# Patient Record
Sex: Male | Born: 1995 | Race: Black or African American | Hispanic: No | Marital: Single | Smoking: Never smoker
Health system: Southern US, Community
[De-identification: ages and names within clinical notes are randomized; demographics above are authoritative.]

---
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
## 2014-02-20 ENCOUNTER — Emergency Department (INDEPENDENT_AMBULATORY_CARE_PROVIDER_SITE_OTHER)
Admission: EM | Admit: 2014-02-20 | Discharge: 2014-02-20 | Disposition: A | Discharge status: Home / Self Care | Payer: Medicaid Other | Source: Home / Self Care | Attending: Family Medicine | Admitting: Family Medicine

## 2014-02-20 ENCOUNTER — Encounter (HOSPITAL_COMMUNITY): Payer: Self-pay | Admitting: Emergency Medicine

## 2014-02-20 DIAGNOSIS — K2970 Gastritis, unspecified, without bleeding: Secondary | ICD-10-CM

## 2014-02-20 MED ORDER — OMEPRAZOLE 40 MG PO CPDR: 40.0000 mg | DELAYED_RELEASE_CAPSULE | Freq: Every day | ORAL | Status: AC

## 2014-02-20 MED ORDER — SUCRALFATE 1 G PO TABS
1.0000 g | ORAL_TABLET | Freq: Three times a day (TID) | ORAL | Status: AC
Start: 2014-02-20 — End: ?

## 2014-02-20 MED ORDER — GI COCKTAIL ~~LOC~~
ORAL | Status: AC
  Filled 2014-02-20: qty 30

## 2014-02-20 MED ORDER — GI COCKTAIL ~~LOC~~
30.0000 mL | Freq: Once | ORAL | Status: AC
  Administered 2014-02-20: 30 mL via ORAL

## 2014-02-20 NOTE — ED Notes (Signed)
Pt here today because he has had chest pain since Thursday, pt said that he took a Xanax bar on Thursday and has had the chest pain and a head ache since that time, pt also said that his stomach has been hurting

## 2014-02-20 NOTE — ED Provider Notes (Signed)
Stephen Mason is a 18 y.o. male who presents to Urgent Care today for chest pain headache abdominal pain and cramping. Symptoms started on Friday 3 days ago. Patient purchase some Xanax on the street and took them Friday night. Saturday morning he started feeling poorly. He denies any central exertional chest pain. No radiating pain. No palpitations or shortness of breath. He feels well otherwise.   No past medical history on file. History  Substance Use Topics  . Smoking status: Never Smoker   . Smokeless tobacco: Not on file  . Alcohol Use: No   ROS as above Medications: No current facility-administered medications for this encounter.   Current Outpatient Prescriptions  Medication Sig Dispense Refill  . omeprazole (PRILOSEC) 40 MG capsule Take 1 capsule (40 mg total) by mouth daily.  30 capsule  1  . sucralfate (CARAFATE) 1 G tablet Take 1 tablet (1 g total) by mouth 4 (four) times daily -  with meals and at bedtime.  60 tablet  0    Exam:  BP 126/72  Pulse 72  Temp(Src) 97.3 F (36.3 C) (Oral)  Resp 14  SpO2 100% Gen: Well NAD HEENT: EOMI,  MMM Lungs: Normal work of breathing. CTABL Heart: RRR no MRG Abd: NABS, Soft. Nondistended, mildly tender upper left quadrant. No rebound or guarding. No masses palpated. Exts: Brisk capillary refill, warm and well perfused.   Patient was given a GI cocktail and felt better.  EKG sinus bradycardia at 59 beats per minute. No ST segment elevation or depression. Mild J-point elevation is present.  No results found for this or any previous visit (from the past 24 hour(s)). No results found.  Assessment and Plan: 18 y.o. male with gastritis. Patient improved with a GI cocktail. Plan to treat with Carafate and omeprazole. Followup with PCP. Avoid street drugs in the future.  Discussed warning signs or symptoms. Please see discharge instructions. Patient expresses understanding.     [name], MD 02/20/14 [phone]

## 2014-02-20 NOTE — Discharge Instructions (Signed)
Thank you for coming in today. Take the carafate with meals for at least 1 weeks.  Take omeprazole daily.  Come back as needed.  If your belly pain worsens, or you have high fever, bad vomiting, blood in your stool or black tarry stool go to the Emergency Room.   Please do not take recreational drugs.    Gastritis, Adult Gastritis is soreness and swelling (inflammation) of the lining of the stomach. Gastritis can develop as a sudden onset (acute) or long-term (chronic) condition. If gastritis is not treated, it can lead to stomach bleeding and ulcers. CAUSES  Gastritis occurs when the stomach lining is weak or damaged. Digestive juices from the stomach then inflame the weakened stomach lining. The stomach lining may be weak or damaged due to viral or bacterial infections. One common bacterial infection is the Helicobacter pylori infection. Gastritis can also result from excessive alcohol consumption, taking certain medicines, or having too much acid in the stomach.  SYMPTOMS  In some cases, there are no symptoms. When symptoms are present, they may include:  Pain or a burning sensation in the upper abdomen.  Nausea.  Vomiting.  An uncomfortable feeling of fullness after eating. DIAGNOSIS  Your caregiver may suspect you have gastritis based on your symptoms and a physical exam. To determine the cause of your gastritis, your caregiver may perform the following:  Blood or stool tests to check for the H pylori bacterium.  Gastroscopy. A thin, flexible tube (endoscope) is passed down the esophagus and into the stomach. The endoscope has a light and camera on the end. Your caregiver uses the endoscope to view the inside of the stomach.  Taking a tissue sample (biopsy) from the stomach to examine under a microscope. TREATMENT  Depending on the cause of your gastritis, medicines may be prescribed. If you have a bacterial infection, such as an H pylori infection, antibiotics may be given. If  your gastritis is caused by too much acid in the stomach, H2 blockers or antacids may be given. Your caregiver may recommend that you stop taking aspirin, ibuprofen, or other nonsteroidal anti-inflammatory drugs (NSAIDs). HOME CARE INSTRUCTIONS  Only take over-the-counter or prescription medicines as directed by your caregiver.  If you were given antibiotic medicines, take them as directed. Finish them even if you start to feel better.  Drink enough fluids to keep your urine clear or pale yellow.  Avoid foods and drinks that make your symptoms worse, such as:  Caffeine or alcoholic drinks.  Chocolate.  Peppermint or mint flavorings.  Garlic and onions.  Spicy foods.  Citrus fruits, such as oranges, lemons, or limes.  Tomato-based foods such as sauce, chili, salsa, and pizza.  Fried and fatty foods.  Eat small, frequent meals instead of large meals. SEEK IMMEDIATE MEDICAL CARE IF:   You have black or dark red stools.  You vomit blood or material that looks like coffee grounds.  You are unable to keep fluids down.  Your abdominal pain gets worse.  You have a fever.  You do not feel better after 1 week.  You have any other questions or concerns. MAKE SURE YOU:  Understand these instructions.  Will watch your condition.  Will get help right away if you are not doing well or get worse. Document Released: 04/29/2001 Document Revised: 11/04/2011 Document Reviewed: 06/18/2011 [facility] Patient Information 2015 [place], [place]. This information is not intended to replace advice given to you by your health care provider. Make sure you discuss any questions  you have with your health care provider.

## 2014-02-21 ENCOUNTER — Encounter (HOSPITAL_COMMUNITY): Payer: Self-pay | Admitting: Emergency Medicine

## 2014-02-21 ENCOUNTER — Emergency Department (HOSPITAL_COMMUNITY): Payer: Medicaid Other

## 2014-02-21 DIAGNOSIS — Z79899 Other long term (current) drug therapy: Secondary | ICD-10-CM | POA: Insufficient documentation

## 2014-02-21 DIAGNOSIS — G43009 Migraine without aura, not intractable, without status migrainosus: Secondary | ICD-10-CM | POA: Diagnosis not present

## 2014-02-21 DIAGNOSIS — R079 Chest pain, unspecified: Secondary | ICD-10-CM | POA: Diagnosis present

## 2014-02-21 DIAGNOSIS — R1084 Generalized abdominal pain: Secondary | ICD-10-CM | POA: Insufficient documentation

## 2014-02-21 DIAGNOSIS — R0789 Other chest pain: Secondary | ICD-10-CM | POA: Insufficient documentation

## 2014-02-21 LAB — BASIC METABOLIC PANEL
Anion gap: 13 (ref 5–15)
BUN: 14 mg/dL (ref 6–23)
CALCIUM: 9.5 mg/dL (ref 8.4–10.5)
CO2: 26 meq/L (ref 19–32)
CREATININE: 1.08 mg/dL (ref 0.50–1.35)
Chloride: 99 mEq/L (ref 96–112)
GFR calc Af Amer: >90 mL/min (ref >90)
GFR calc non Af Amer: >90 mL/min (ref >90)
GLUCOSE: 108 mg/dL — AB (ref 70–99)
Potassium: 4 mEq/L (ref 3.7–5.3)
Sodium: 138 mEq/L (ref 137–147)

## 2014-02-21 LAB — CBC
HEMATOCRIT: 39.8 % (ref 39.0–52.0)
HEMOGLOBIN: 14 g/dL (ref 13.0–17.0)
MCH: 30.9 pg (ref 26.0–34.0)
MCHC: 35.2 g/dL (ref 30.0–36.0)
MCV: 87.9 fL (ref 78.0–100.0)
Platelets: 215 10*3/uL (ref 150–400)
RBC: 4.53 MIL/uL (ref 4.22–5.81)
RDW: 12.7 % (ref 11.5–15.5)
WBC: 5.8 10*3/uL (ref 4.0–10.5)

## 2014-02-21 LAB — I-STAT TROPONIN, ED: Troponin i, poc: 0 ng/mL (ref 0.00–0.08)

## 2014-02-21 NOTE — ED Notes (Signed)
Pt. reported intermittent mid chest pain , mid abdominal pain and headache onset Friday last week , denies SOB /nausea or diaphoresis .

## 2014-02-22 ENCOUNTER — Encounter (HOSPITAL_COMMUNITY): Payer: Self-pay | Admitting: Emergency Medicine

## 2014-02-22 ENCOUNTER — Emergency Department (HOSPITAL_COMMUNITY)
Admission: EM | Admit: 2014-02-22 | Discharge: 2014-02-22 | Disposition: A | Discharge status: Home / Self Care | Payer: Medicaid Other | Attending: Emergency Medicine | Admitting: Emergency Medicine

## 2014-02-22 DIAGNOSIS — R0789 Other chest pain: Secondary | ICD-10-CM

## 2014-02-22 DIAGNOSIS — G43009 Migraine without aura, not intractable, without status migrainosus: Secondary | ICD-10-CM

## 2014-02-22 DIAGNOSIS — R1084 Generalized abdominal pain: Secondary | ICD-10-CM

## 2014-02-22 LAB — RAPID URINE DRUG SCREEN, HOSP PERFORMED
Amphetamines: NOT DETECTED
Barbiturates: NOT DETECTED
Benzodiazepines: NOT DETECTED
COCAINE: NOT DETECTED
Opiates: NOT DETECTED
TETRAHYDROCANNABINOL: NOT DETECTED

## 2014-02-22 LAB — HEPATIC FUNCTION PANEL
ALBUMIN: 4.1 g/dL (ref 3.5–5.2)
ALK PHOS: 63 U/L (ref 39–117)
ALT: 15 U/L (ref 0–53)
AST: 22 U/L (ref 0–37)
BILIRUBIN TOTAL: 0.3 mg/dL (ref 0.3–1.2)
Bilirubin, Direct: <0.2 mg/dL (ref 0.0–0.3)
Total Protein: 7.6 g/dL (ref 6.0–8.3)

## 2014-02-22 LAB — SALICYLATE LEVEL

## 2014-02-22 LAB — LIPASE, BLOOD: Lipase: 17 U/L (ref 11–59)

## 2014-02-22 LAB — ACETAMINOPHEN LEVEL: Acetaminophen (Tylenol), Serum: <15 ug/mL (ref 10–30)

## 2014-02-22 MED ORDER — GI COCKTAIL ~~LOC~~
30.0000 mL | Freq: Once | ORAL | Status: AC
Start: 2014-02-22 — End: 2014-02-22
  Administered 2014-02-22: 30 mL via ORAL
  Filled 2014-02-22: qty 30

## 2014-02-22 MED ORDER — ACETAMINOPHEN 500 MG PO TABS
1000.0000 mg | ORAL_TABLET | Freq: Once | ORAL | Status: AC
  Administered 2014-02-22: 1000 mg via ORAL
  Filled 2014-02-22: qty 2

## 2014-02-22 MED ORDER — ONDANSETRON 4 MG PO TBDP
4.0000 mg | ORAL_TABLET | Freq: Once | ORAL | Status: AC
  Administered 2014-02-22: 4 mg via ORAL
  Filled 2014-02-22: qty 1

## 2014-02-22 NOTE — ED Notes (Signed)
Reports: tired and dizzy (mild), (denies: pain, sob, nausea or other sx), alert, NAD, calm, interactive, [facility]

## 2014-02-22 NOTE — Discharge Instructions (Signed)
Chest Pain (Nonspecific) °It is often hard to give a specific diagnosis for the cause of chest pain. There is always a chance that your pain could be related to something serious, such as a heart attack or a blood clot in the lungs. You need to follow up with your health care provider for further evaluation. °CAUSES  °· Heartburn. °· Pneumonia or bronchitis. °· Anxiety or stress. °· Inflammation around your heart (pericarditis) or lung (pleuritis or pleurisy). °· A blood clot in the lung. °· A collapsed lung (pneumothorax). It can develop suddenly on its own (spontaneous pneumothorax) or from trauma to the chest. °· Shingles infection (herpes zoster virus). °The chest wall is composed of bones, muscles, and cartilage. Any of these can be the source of the pain. °· The bones can be bruised by injury. °· The muscles or cartilage can be strained by coughing or overwork. °· The cartilage can be affected by inflammation and become sore (costochondritis). °DIAGNOSIS  °Lab tests or other studies may be needed to find the cause of your pain. Your health care provider may have you take a test called an ambulatory electrocardiogram (ECG). An ECG records your heartbeat patterns over a 24-hour period. You may also have other tests, such as: °· Transthoracic echocardiogram (TTE). During echocardiography, sound waves are used to evaluate how blood flows through your heart. °· Transesophageal echocardiogram (TEE). °· Cardiac monitoring. This allows your health care provider to monitor your heart rate and rhythm in real time. °· Holter monitor. This is a portable device that records your heartbeat and can help diagnose heart arrhythmias. It allows your health care provider to track your heart activity for several days, if needed. °· Stress tests by exercise or by giving medicine that makes the heart beat faster. °TREATMENT  °· Treatment depends on what may be causing your chest pain. Treatment may include: °¨ Acid blockers for  heartburn. °¨ Anti-inflammatory medicine. °¨ Pain medicine for inflammatory conditions. °¨ Antibiotics if an infection is present. °· You may be advised to change lifestyle habits. This includes stopping smoking and avoiding alcohol, caffeine, and chocolate. °· You may be advised to keep your head raised (elevated) when sleeping. This reduces the chance of acid going backward from your stomach into your esophagus. °Most of the time, nonspecific chest pain will improve within 2-3 days with rest and mild pain medicine.  °HOME CARE INSTRUCTIONS  °· If antibiotics were prescribed, take them as directed. Finish them even if you start to feel better. °· For the next few days, avoid physical activities that bring on chest pain. Continue physical activities as directed. °· Do not use any tobacco products, including cigarettes, chewing tobacco, or electronic cigarettes. °· Avoid drinking alcohol. °· Only take medicine as directed by your health care provider. °· Follow your health care provider's suggestions for further testing if your chest pain does not go away. °· Keep any follow-up appointments you made. If you do not go to an appointment, you could develop lasting (chronic) problems with pain. If there is any problem keeping an appointment, call to reschedule. °SEEK MEDICAL CARE IF:  °· Your chest pain does not go away, even after treatment. °· You have a rash with blisters on your chest. °· You have a fever. °SEEK IMMEDIATE MEDICAL CARE IF:  °· You have increased chest pain or pain that spreads to your arm, neck, jaw, back, or abdomen. °· You have shortness of breath. °· You have an increasing cough, or you cough   up blood.  You have severe back or abdominal pain.  You feel nauseous or vomit.  You have severe weakness.  You faint.  You have chills. This is an emergency. Do not wait to see if the pain will go away. Get medical help at once. Call your local emergency services (911 in [place] Do not drive  yourself to the hospital. MAKE SURE YOU:   Understand these instructions.  Will watch your condition.  Will get help right away if you are not doing well or get worse. Document Released: 02/12/2005 Document Revised: 05/10/2013 Document Reviewed: 12/09/2007 [facility] Patient Information 2015 [place], [place]. This information is not intended to replace advice given to you by your health care provider. Make sure you discuss any questions you have with your health care provider.  Abdominal Pain Many things can cause abdominal pain. Usually, abdominal pain is not caused by a disease and will improve without treatment. It can often be observed and treated at home. Your health care provider will do a physical exam and possibly order blood tests and X-rays to help determine the seriousness of your pain. However, in many cases, more time must pass before a clear cause of the pain can be found. Before that point, your health care provider may not know if you need more testing or further treatment. HOME CARE INSTRUCTIONS  Monitor your abdominal pain for any changes. The following actions may help to alleviate any discomfort you are experiencing:  Only take over-the-counter or prescription medicines as directed by your health care provider.  Do not take laxatives unless directed to do so by your health care provider.  Try a clear liquid diet (broth, tea, or water) as directed by your health care provider. Slowly move to a bland diet as tolerated. SEEK MEDICAL CARE IF:  You have unexplained abdominal pain.  You have abdominal pain associated with nausea or diarrhea.  You have pain when you urinate or have a bowel movement.  You experience abdominal pain that wakes you in the night.  You have abdominal pain that is worsened or improved by eating food.  You have abdominal pain that is worsened with eating fatty foods.  You have a fever. SEEK IMMEDIATE MEDICAL CARE IF:   Your pain does not go  away within 2 hours.  You keep throwing up (vomiting).  Your pain is felt only in portions of the abdomen, such as the right side or the left lower portion of the abdomen.  You pass bloody or black tarry stools. MAKE SURE YOU:  Understand these instructions.   Will watch your condition.   Will get help right away if you are not doing well or get worse.  Document Released: 02/12/2005 Document Revised: 05/10/2013 Document Reviewed: 01/12/2013 [facility] Patient Information 2015 [place], [place]. This information is not intended to replace advice given to you by your health care provider. Make sure you discuss any questions you have with your health care provider.  Migraine Headache A migraine headache is an intense, throbbing pain on one or both sides of your head. A migraine can last for 30 minutes to several hours. CAUSES  The exact cause of a migraine headache is not always known. However, a migraine may be caused when nerves in the brain become irritated and release chemicals that cause inflammation. This causes pain. Certain things may also trigger migraines, such as:  Alcohol.  Smoking.  Stress.  Menstruation.  Aged cheeses.  Foods or drinks that contain nitrates, glutamate, aspartame, or  tyramine.  Lack of sleep.  Chocolate.  Caffeine.  Hunger.  Physical exertion.  Fatigue.  Medicines used to treat chest pain (nitroglycerine), birth control pills, estrogen, and some blood pressure medicines. SIGNS AND SYMPTOMS  Pain on one or both sides of your head.  Pulsating or throbbing pain.  Severe pain that prevents daily activities.  Pain that is aggravated by any physical activity.  Nausea, vomiting, or both.  Dizziness.  Pain with exposure to bright lights, loud noises, or activity.  General sensitivity to bright lights, loud noises, or smells. Before you get a migraine, you may get warning signs that a migraine is coming (aura). An aura may  include:  Seeing flashing lights.  Seeing bright spots, halos, or zigzag lines.  Having tunnel vision or blurred vision.  Having feelings of numbness or tingling.  Having trouble talking.  Having muscle weakness. DIAGNOSIS  A migraine headache is often diagnosed based on:  Symptoms.  Physical exam.  A CT scan or MRI of your head. These imaging tests cannot diagnose migraines, but they can help rule out other causes of headaches. TREATMENT Medicines may be given for pain and nausea. Medicines can also be given to help prevent recurrent migraines.  HOME CARE INSTRUCTIONS  Only take over-the-counter or prescription medicines for pain or discomfort as directed by your health care provider. The use of long-term narcotics is not recommended.  Lie down in a dark, quiet room when you have a migraine.  Keep a journal to find out what may trigger your migraine headaches. For example, write down:  What you eat and drink.  How much sleep you get.  Any change to your diet or medicines.  Limit alcohol consumption.  Quit smoking if you smoke.  Get 7-9 hours of sleep, or as recommended by your health care provider.  Limit stress.  Keep lights dim if bright lights bother you and make your migraines worse. SEEK IMMEDIATE MEDICAL CARE IF:   Your migraine becomes severe.  You have a fever.  You have a stiff neck.  You have vision loss.  You have muscular weakness or loss of muscle control.  You start losing your balance or have trouble walking.  You feel faint or pass out.  You have severe symptoms that are different from your first symptoms. MAKE SURE YOU:   Understand these instructions.  Will watch your condition.  Will get help right away if you are not doing well or get worse. Document Released: 05/05/2005 Document Revised: 09/19/2013 Document Reviewed: 01/10/2013 [facility] Patient Information 2015 [place], [place]. This information is not intended to replace  advice given to you by your health care provider. Make sure you discuss any questions you have with your health care provider.

## 2014-02-22 NOTE — ED Provider Notes (Signed)
TIME SEEN: 3:00 AM  CHIEF COMPLAINT: Headache, chest pain, abdominal pain  HPI: Patient is a 18 year old male with history of migraine headaches who presents to the emergency department with multiple complaints. He reports that on Friday, 4 days ago he took 3 Xanax tablets from a friend. He is not prescribed this medication. He is not 100% sure that this was a Xanax. He states that he is having a frontal "dense" headache that he has had in the past that usually resolves with Aleve but did not take anything for his headache. He is also having left-sided chest pain that he describes feeling like he was "punched" in the chest. He states that walking makes his pain worse and movement. It is not exertional or pleuritic. There is no shortness of breath. He is also having diffuse abdominal pain that he cannot describe. He has had nausea but no vomiting or diarrhea. Reports he has had chills but no documented fevers. Seen in urgent care yesterday and given GI cocktail which improved his pain. He was discharged with omeprazole and Carafate. Denies any sick contacts or recent travel. No history of PE or DVT, recent prolonged immobilization such as long flight or hospitalization, fracture, surgery, trauma. His mother brought him in today because she is concerned that the pills that he took 4 days ago may be affecting him. Denies any other coingestants. No heavy NSAID use. No bloody stools or melena.  ROS: See HPI Constitutional: no fever  Eyes: no drainage  ENT: no runny nose   Cardiovascular:  no chest pain  Resp: no SOB  GI: no vomiting GU: no dysuria Integumentary: no rash  Allergy: no hives  Musculoskeletal: no leg swelling  Neurological: no slurred speech ROS otherwise negative  PAST MEDICAL HISTORY/PAST SURGICAL HISTORY:  History reviewed. No pertinent past medical history.  MEDICATIONS:  Prior to Admission medications   Medication Sig Start Date End Date Taking? Authorizing Provider  omeprazole  (PRILOSEC) 40 MG capsule Take 1 capsule (40 mg total) by mouth daily. 02/20/14  Yes [name], MD  sucralfate (CARAFATE) 1 G tablet Take 1 tablet (1 g total) by mouth 4 (four) times daily -  with meals and at bedtime. 02/20/14  Yes [name], MD    ALLERGIES:  No Known Allergies  SOCIAL HISTORY:  History  Substance Use Topics  . Smoking status: Never Smoker   . Smokeless tobacco: Not on file  . Alcohol Use: No    FAMILY HISTORY: No family history on file.  EXAM: BP 148/93  Pulse 56  Temp(Src) 98.4 F (36.9 C) (Oral)  Resp 18  Ht 5\' 9"  (1.753 m)  Wt 188 lb (85.276 kg)  BMI 27.75 kg/m2  SpO2 100% CONSTITUTIONAL: Alert and oriented and responds appropriately to questions. Well-appearing; well-nourished HEAD: Normocephalic EYES: Conjunctivae clear, PERRL ENT: normal nose; no rhinorrhea; moist mucous membranes; pharynx without lesions noted NECK: Supple, no meningismus, no LAD  CARD: RRR; S1 and S2 appreciated; no murmurs, no clicks, no rubs, no gallops RESP: Normal chest excursion without splinting or tachypnea; breath sounds clear and equal bilaterally; no wheezes, no rhonchi, no rales, speaking full sentences, no respiratory distress or hypoxia ABD/GI: Normal bowel sounds; non-distended; soft, non-tender, no rebound, no guarding, no peritoneal signs, negative Murphy sign, no tenderness at McBurney's point BACK:  The back appears normal and is non-tender to palpation, there is no CVA tenderness EXT: Normal ROM in all joints; non-tender to palpation; no edema; normal capillary refill; no cyanosis  SKIN: Normal color for age and race; warm NEURO: Moves all extremities equally, sensation to light touch intact diffusely, cranial nerves 2 through contact, normal gait PSYCH: The patient's mood and manner are appropriate. Grooming and personal hygiene are appropriate.  MEDICAL DECISION MAKING: Patient here with multiple complaints. Have very low suspicion for any  life-threatening injury or illness today. He is hemodynamically stable, neurologically intact, has a benign abdominal exam. Labs ordered in triage and then completely normal including a negative troponin and chest x-ray. EKG shows early repolarization without reciprocal changes that is similar to his prior EKG. He is PERC negative and has no risk factors for ACS. Suspect some of this may be related to gastritis, GERD. We'll give GI cocktail. We'll give him Tylenol for his headache and Zofran for his nausea. We'll on LFTs and lipase given his abdominal pain and obtain a salicylate, Tylenol level and UDS given his history of unknown ingestion. Anticipate if workup is unremarkable and the patient will be discharged home. Family comfortable with plan.  ED PROGRESS: Patient reports feeling better after Tylenol, Zofran and GI cocktail. His LFTs, lipase, urine drug screen, Tylenol and salicylate levels were normal. At that he is safe to be discharged home. Have advised him to avoid NSAIDs, alcohol, spicy or acidic foods at this time. We'll have him continue to use Tylenol as needed for headaches. We'll give work and school note for tomorrow. Discussed return precautions. Patient and mother at bedside verbalize understanding and are comfortable with plan.     EKG Interpretation  Date/Time:  Tuesday February 21 2014 23:13:35 EDT Ventricular Rate:  54 PR Interval:  162 QRS Duration: 100 QT Interval:  400 QTC Calculation: 379 R Axis:   78 Text Interpretation:  Sinus bradycardia with sinus arrhythmia Early repolarization Otherwise normal ECG No significant change since last tracing Confirmed by WARD,  DO, KRISTEN [phone]) on 02/22/2014 2:45:08 AM        Layla Maw Ward, DO 02/22/14 [id number]

## 2015-10-24 ENCOUNTER — Ambulatory Visit (HOSPITAL_COMMUNITY)
Admission: EM | Admit: 2015-10-24 | Discharge: 2015-10-24 | Disposition: A | Discharge status: Home / Self Care | Payer: Medicaid Other | Attending: Emergency Medicine | Admitting: Emergency Medicine

## 2015-10-24 ENCOUNTER — Encounter (HOSPITAL_COMMUNITY): Payer: Self-pay | Admitting: *Deleted

## 2015-10-24 DIAGNOSIS — A64 Unspecified sexually transmitted disease: Secondary | ICD-10-CM

## 2015-10-24 MED ORDER — AZITHROMYCIN 250 MG PO TABS
1000.0000 mg | ORAL_TABLET | Freq: Once | ORAL | Status: AC
  Administered 2015-10-24: 1000 mg via ORAL

## 2015-10-24 MED ORDER — CEFTRIAXONE SODIUM 250 MG IJ SOLR
INTRAMUSCULAR | Status: AC
  Filled 2015-10-24: qty 250

## 2015-10-24 MED ORDER — AZITHROMYCIN 250 MG PO TABS
ORAL_TABLET | ORAL | Status: AC
  Filled 2015-10-24: qty 4

## 2015-10-24 MED ORDER — CEFTRIAXONE SODIUM 250 MG IJ SOLR
250.0000 mg | Freq: Once | INTRAMUSCULAR | Status: AC
  Administered 2015-10-24: 250 mg via INTRAMUSCULAR

## 2015-10-24 NOTE — ED Notes (Signed)
Pt  Reports  Symptoms  Of  Penile  Discharge     With  Onset    yest    Unprotected  Sex  5  Days  Ago

## 2015-10-24 NOTE — Discharge Instructions (Signed)
Sexually Transmitted Disease  A sexually transmitted disease (STD) is a disease or infection that may be passed (transmitted) from person to person, usually during sexual activity. This may happen by way of saliva, semen, blood, vaginal mucus, or urine. Common STDs include:  · Gonorrhea.  · Chlamydia.  · Syphilis.  · HIV and AIDS.  · Genital herpes.  · Hepatitis B and C.  · Trichomonas.  · Human papillomavirus (HPV).  · Pubic lice.  · Scabies.  · Mites.  · Bacterial vaginosis.  WHAT ARE CAUSES OF STDs?  An STD may be caused by bacteria, a virus, or parasites. STDs are often transmitted during sexual activity if one person is infected. However, they may also be transmitted through nonsexual means. STDs may be transmitted after:   · Sexual intercourse with an infected person.  · Sharing sex toys with an infected person.  · Sharing needles with an infected person or using unclean piercing or tattoo needles.  · Having intimate contact with the genitals, mouth, or rectal areas of an infected person.  · Exposure to infected fluids during birth.  WHAT ARE THE SIGNS AND SYMPTOMS OF STDs?  Different STDs have different symptoms. Some people may not have any symptoms. If symptoms are present, they may include:  · Painful or bloody urination.  · Pain in the pelvis, abdomen, vagina, anus, throat, or eyes.  · A skin rash, itching, or irritation.  · Growths, ulcerations, blisters, or sores in the genital and anal areas.  · Abnormal vaginal discharge with or without bad odor.  · Penile discharge in men.  · Fever.  · Pain or bleeding during sexual intercourse.  · Swollen glands in the groin area.  · Yellow skin and eyes (jaundice). This is seen with hepatitis.  · Swollen testicles.  · Infertility.  · Sores and blisters in the mouth.  HOW ARE STDs DIAGNOSED?  To make a diagnosis, your health care provider may:  · Take a medical history.  · Perform a physical exam.  · Take a sample of any discharge to examine.  · Swab the throat,  cervix, opening to the penis, rectum, or vagina for testing.  · Test a sample of your first morning urine.  · Perform blood tests.  · Perform a Pap test, if this applies.  · Perform a colposcopy.  · Perform a laparoscopy.  HOW ARE STDs TREATED?  Treatment depends on the STD. Some STDs may be treated but not cured.  · Chlamydia, gonorrhea, trichomonas, and syphilis can be cured with antibiotic medicine.  · Genital herpes, hepatitis, and HIV can be treated, but not cured, with prescribed medicines. The medicines lessen symptoms.  · Genital warts from HPV can be treated with medicine or by freezing, burning (electrocautery), or surgery. Warts may come back.  · HPV cannot be cured with medicine or surgery. However, abnormal areas may be removed from the cervix, vagina, or vulva.  · If your diagnosis is confirmed, your recent sexual partners need treatment. This is true even if they are symptom-free or have a negative culture or evaluation. They should not have sex until their health care providers say it is okay.  · Your health care provider may test you for infection again 3 months after treatment.  HOW CAN I REDUCE MY RISK OF GETTING AN STD?  Take these steps to reduce your risk of getting an STD:  · Use latex condoms, dental dams, and water-soluble lubricants during sexual activity. Do not use   petroleum jelly or oils.  · Avoid having multiple sex partners.  · Do not have sex with someone who has other sex partners  · Do not have sex with anyone you do not know or who is at high risk for an STD.  · Avoid risky sex practices that can break your skin.  · Do not have sex if you have open sores on your mouth or skin.  · Avoid drinking too much alcohol or taking illegal drugs. Alcohol and drugs can affect your judgment and put you in a vulnerable position.  · Avoid engaging in oral and anal sex acts.  · Get vaccinated for HPV and hepatitis. If you have not received these vaccines in the past, talk to your health care  provider about whether one or both might be right for you.  · If you are at risk of being infected with HIV, it is recommended that you take a prescription medicine daily to prevent HIV infection. This is called pre-exposure prophylaxis (PrEP). You are considered at risk if:    You are a man who has sex with other men (MSM).    You are a heterosexual man or woman and are sexually active with more than one partner.    You take drugs by injection.    You are sexually active with a partner who has HIV.  · Talk with your health care provider about whether you are at high risk of being infected with HIV. If you choose to begin PrEP, you should first be tested for HIV. You should then be tested every 3 months for as long as you are taking PrEP.  WHAT SHOULD I DO IF I THINK I HAVE AN STD?  · See your health care provider.  · Tell your sexual partner(s). They should be tested and treated for any STDs.  · Do not have sex until your health care provider says it is okay.  WHEN SHOULD I GET IMMEDIATE MEDICAL CARE?  Contact your health care provider right away if:   · You have severe abdominal pain.  · You are a man and notice swelling or pain in your testicles.  · You are a woman and notice swelling or pain in your vagina.     This information is not intended to replace advice given to you by your health care provider. Make sure you discuss any questions you have with your health care provider.     Document Released: 07/26/2002 Document Revised: 05/26/2014 Document Reviewed: 11/23/2012  Elsevier Interactive Patient Education ©2016 [employer]    Safe Sex  Safe sex is about reducing the risk of giving or getting a sexually transmitted disease (STD). STDs are spread through sexual contact involving the genitals, mouth, or rectum. Some STDs can be cured and others cannot. Safe sex can also prevent unintended pregnancies.   WHAT ARE SOME SAFE SEX PRACTICES?  · Limit your sexual activity to only one partner who is having sex with  only you.  · Talk to your partner about his or her past partners, past STDs, and drug use.  · Use a condom every time you have sexual intercourse. This includes vaginal, oral, and anal sexual activity. Both females and males should wear condoms during oral sex. Only use latex or polyurethane condoms and water-based lubricants. Using petroleum-based lubricants or oils to lubricate a condom will weaken the condom and increase the chance that it will break. The condom should be in place from the beginning to   the end of sexual activity. Wearing a condom reduces, but does not completely eliminate, your risk of getting or giving an STD. STDs can be spread by contact with infected body fluids and skin.  · Get vaccinated for hepatitis B and HPV.  · Avoid alcohol and recreational drugs, which can affect your judgment. You may forget to use a condom or participate in high-risk sex.  · For females, avoid douching after sexual intercourse. Douching can spread an infection farther into the reproductive tract.  · Check your body for signs of sores, blisters, rashes, or unusual discharge. See your health care provider if you notice any of these signs.  · Avoid sexual contact if you have symptoms of an infection or are being treated for an STD. If you or your partner has herpes, avoid sexual contact when blisters are present. Use condoms at all other times.  · If you are at risk of being infected with HIV, it is recommended that you take a prescription medicine daily to prevent HIV infection. This is called pre-exposure prophylaxis (PrEP). You are considered at risk if:    You are a man who has sex with other men (MSM).    You are a heterosexual man or woman who is sexually active with more than one partner.    You take drugs by injection.    You are sexually active with a partner who has HIV.  · Talk with your health care provider about whether you are at high risk of being infected with HIV. If you choose to begin PrEP, you  should first be tested for HIV. You should then be tested every 3 months for as long as you are taking PrEP.  · See your health care provider for regular screenings, exams, and tests for other STDs. Before having sex with a new partner, each of you should be screened for STDs and should talk about the results with each other.  WHAT ARE THE BENEFITS OF SAFE SEX?   · There is less chance of getting or giving an STD.  · You can prevent unwanted or unintended pregnancies.  · By discussing safe sex concerns with your partner, you may increase feelings of intimacy, comfort, trust, and honesty between the two of you.     This information is not intended to replace advice given to you by your health care provider. Make sure you discuss any questions you have with your health care provider.     Document Released: 06/12/2004 Document Revised: 05/26/2014 Document Reviewed: 10/27/2011  Elsevier Interactive Patient Education ©2016 [employer]

## 2015-10-24 NOTE — ED Notes (Signed)
Pt informed  To  Give  [place]  A  Working  Phone  Number before  He  Leaves    adised  No  Sex  X  10  Days  Advised  To  Use  Condoms

## 2015-10-24 NOTE — ED Provider Notes (Signed)
CSN: [id number]     Arrival date & time 10/24/15  1810 History   First MD Initiated Contact with Patient 10/24/15 1913     Chief Complaint  Patient presents with  . Penile Discharge   (Consider location/radiation/quality/duration/timing/severity/associated sxs/prior Treatment) HPI History obtained from patient:  Pt presents with the cc of:  Discharge from penis Duration of symptoms: Started Sunday Treatment prior to arrival: Notes prior treatment Context: New sexual partner Saturday night onset of burning sensations Sunday and discharged from penis today Other symptoms include: Dysuria Pain score: 2 with urination FAMILY HISTORY: Family history of hypertension       History reviewed. No pertinent past medical history. History reviewed. No pertinent past surgical history. History reviewed. No pertinent family history. Social History  Substance Use Topics  . Smoking status: Never Smoker   . Smokeless tobacco: None  . Alcohol Use: No    Review of Systems  Denies: HEADACHE, NAUSEA, ABDOMINAL PAIN, CHEST PAIN, CONGESTION, DYSURIA, SHORTNESS OF BREATH  Allergies  Review of patient's allergies indicates no known allergies.  Home Medications   Prior to Admission medications   Medication Sig Start Date End Date Taking? Authorizing Provider  omeprazole (PRILOSEC) 40 MG capsule Take 1 capsule (40 mg total) by mouth daily. 02/20/14   [name], MD  sucralfate (CARAFATE) 1 G tablet Take 1 tablet (1 g total) by mouth 4 (four) times daily -  with meals and at bedtime. 02/20/14   [name], MD   Meds Ordered and Administered this Visit   Medications  azithromycin (ZITHROMAX) tablet 1,000 mg (not administered)  cefTRIAXone (ROCEPHIN) injection 250 mg (not administered)    BP 139/83 mmHg  Pulse 66  Temp(Src) 98.4 F (36.9 C) (Oral)  Resp 16  SpO2 100% No data found.   Physical Exam NURSES NOTES AND VITAL SIGNS REVIEWED. CONSTITUTIONAL: Well developed, well nourished,  no acute distress HEENT: normocephalic, atraumatic EYES: Conjunctiva normal NECK:normal ROM, supple, no adenopathy PULMONARY:No respiratory distress, normal effort ABDOMINAL: Soft, ND, NT BS+, No CVAT MUSCULOSKELETAL: Normal ROM of all extremities,  SKIN: warm and dry without rash PSYCHIATRIC: Mood and affect, behavior are normal   OFFER FOR CHAPARONE, DECLINED. GENITALIA: NORMAL EXTERNAL MALE PENIS AND SCROTUM.  GREEN DISCHARGE FROM PENIS, NO PALPABLE NODES.   ED Course  Procedures (including critical care time)  Labs Review Labs Reviewed  HIV ANTIBODY (ROUTINE TESTING)  CYTOLOGY, (ORAL, ANAL, URETHRAL) ANCILLARY ONLY  Pending  Imaging Review No results found.   Visual Acuity Review  Right Eye Distance:   Left Eye Distance:   Bilateral Distance:    Right Eye Near:   Left Eye Near:    Bilateral Near:      Expect full recovery from STD. We'll treat with azithromycin and ceftriaxone.  HIV test pending MDM   1. STD (sexually transmitted disease)     Patient is reassured that there are no issues that require transfer to higher level of care at this time or additional tests. Patient is advised to continue home symptomatic treatment. Patient is advised that if there are new or worsening symptoms to attend the emergency department, contact primary care provider, or return to [facility]. Instructions of care provided discharged home in stable condition.    THIS NOTE WAS GENERATED USING A VOICE RECOGNITION SOFTWARE PROGRAM. ALL REASONABLE EFFORTS  WERE MADE TO PROOFREAD THIS DOCUMENT FOR ACCURACY.  I have verbally reviewed the discharge instructions with the patient. A printed AVS was given to the patient.  All questions were answered prior to discharge.      Tharon Aquas, PA 10/24/15 1926

## 2015-10-25 LAB — CYTOLOGY, (ORAL, ANAL, URETHRAL) ANCILLARY ONLY
Chlamydia: NEGATIVE
Neisseria Gonorrhea: POSITIVE — AB
TRICH (WINDOWPATH): NEGATIVE

## 2015-10-25 LAB — HIV ANTIBODY (ROUTINE TESTING W REFLEX): HIV SCREEN 4TH GENERATION: NONREACTIVE

## 2015-10-30 ENCOUNTER — Telehealth (HOSPITAL_COMMUNITY): Payer: Self-pay | Admitting: Emergency Medicine

## 2015-10-30 NOTE — ED Notes (Signed)
Called [phone]; n/a Need to give lab results from recent visit on 6/7 Also let pt know labs can be obtained from MyChart  Per Dr. Dayton ScrapeMurray,  Notes Recorded by Eustace MooreLaura W Murray, MD on 10/27/2015 at 6:37 PM Please let patient/health department know that test for gonorrhea was positive; this was treated at [facility] visit with IM rocephin 250 mg and po zithromax 1g. Sexual partners need to be notified and tested/treated.  Tests for chlamydia, trichomonas, and HIV were all negative. Recheck as needed. LM   [facility]

## 2015-11-05 NOTE — ED Notes (Signed)
Called [phone]; n/a... Mailed letter as 3rd attempt Need to give lab results from recent visit on 6/7 Also let pt know labs can be obtained from MyChart  Per Dr. Dayton ScrapeMurray,  Notes Recorded by Eustace MooreLaura W Murray, MD on 10/27/2015 at 6:37 PM Please let patient/health department know that test for gonorrhea was positive; [facility] rocephin 250 mg and po zithromax 1g. Sexual partners need to be notified and tested/treated.  Tests for chlamydia, trichomonas, and HIV were all negative. Recheck as needed. LM  [facility]

## 2015-11-17 IMAGING — CR DG CHEST 2V
2 series · 2 of 2 positions shown · non-contrast
Comparison: None.

CLINICAL DATA: Acute onset of mid to left-sided chest pain and
migraine headache. Generalized abdominal pain and cough. Initial
encounter.

EXAM:
CHEST  2 VIEW

[w chest pa]
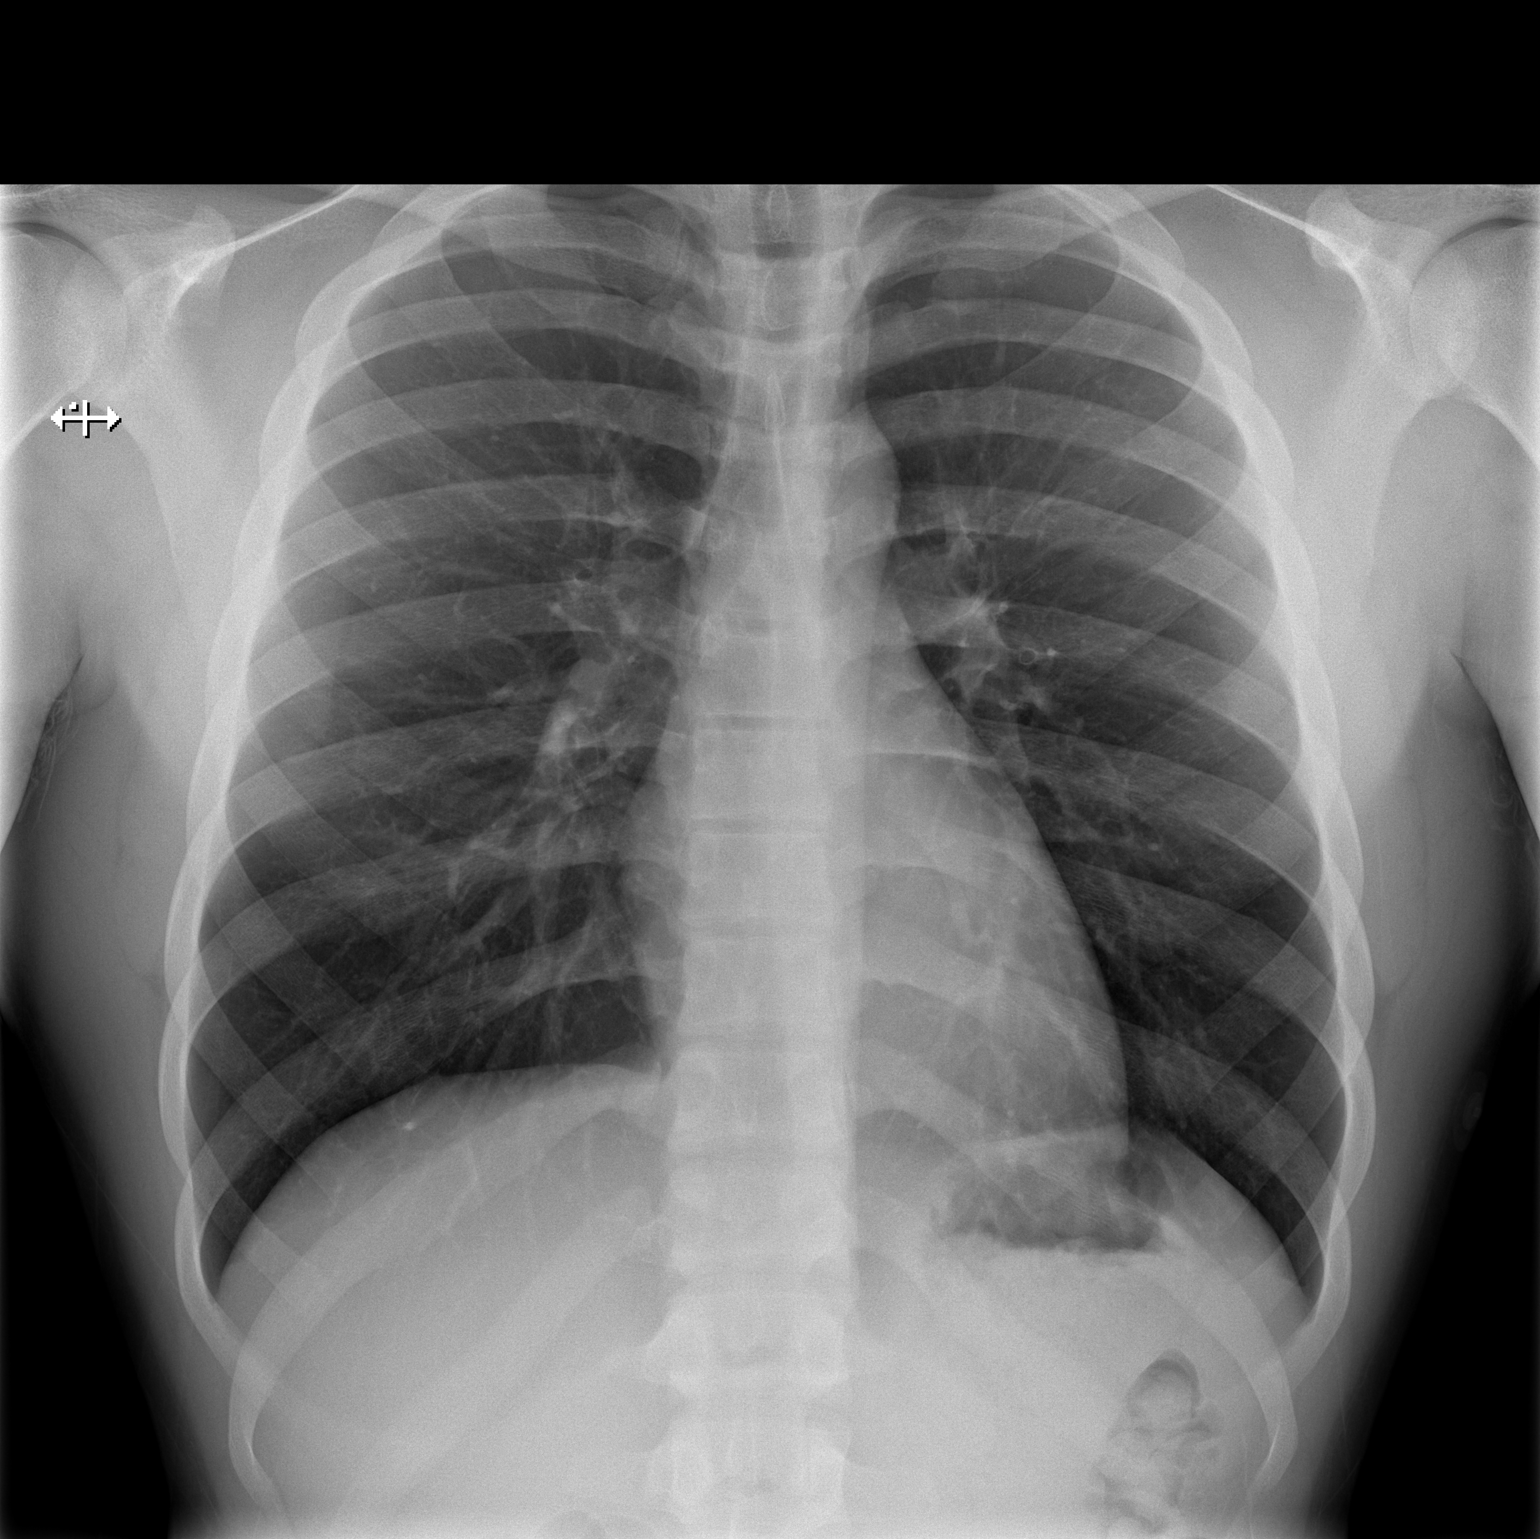

[w chest lat]
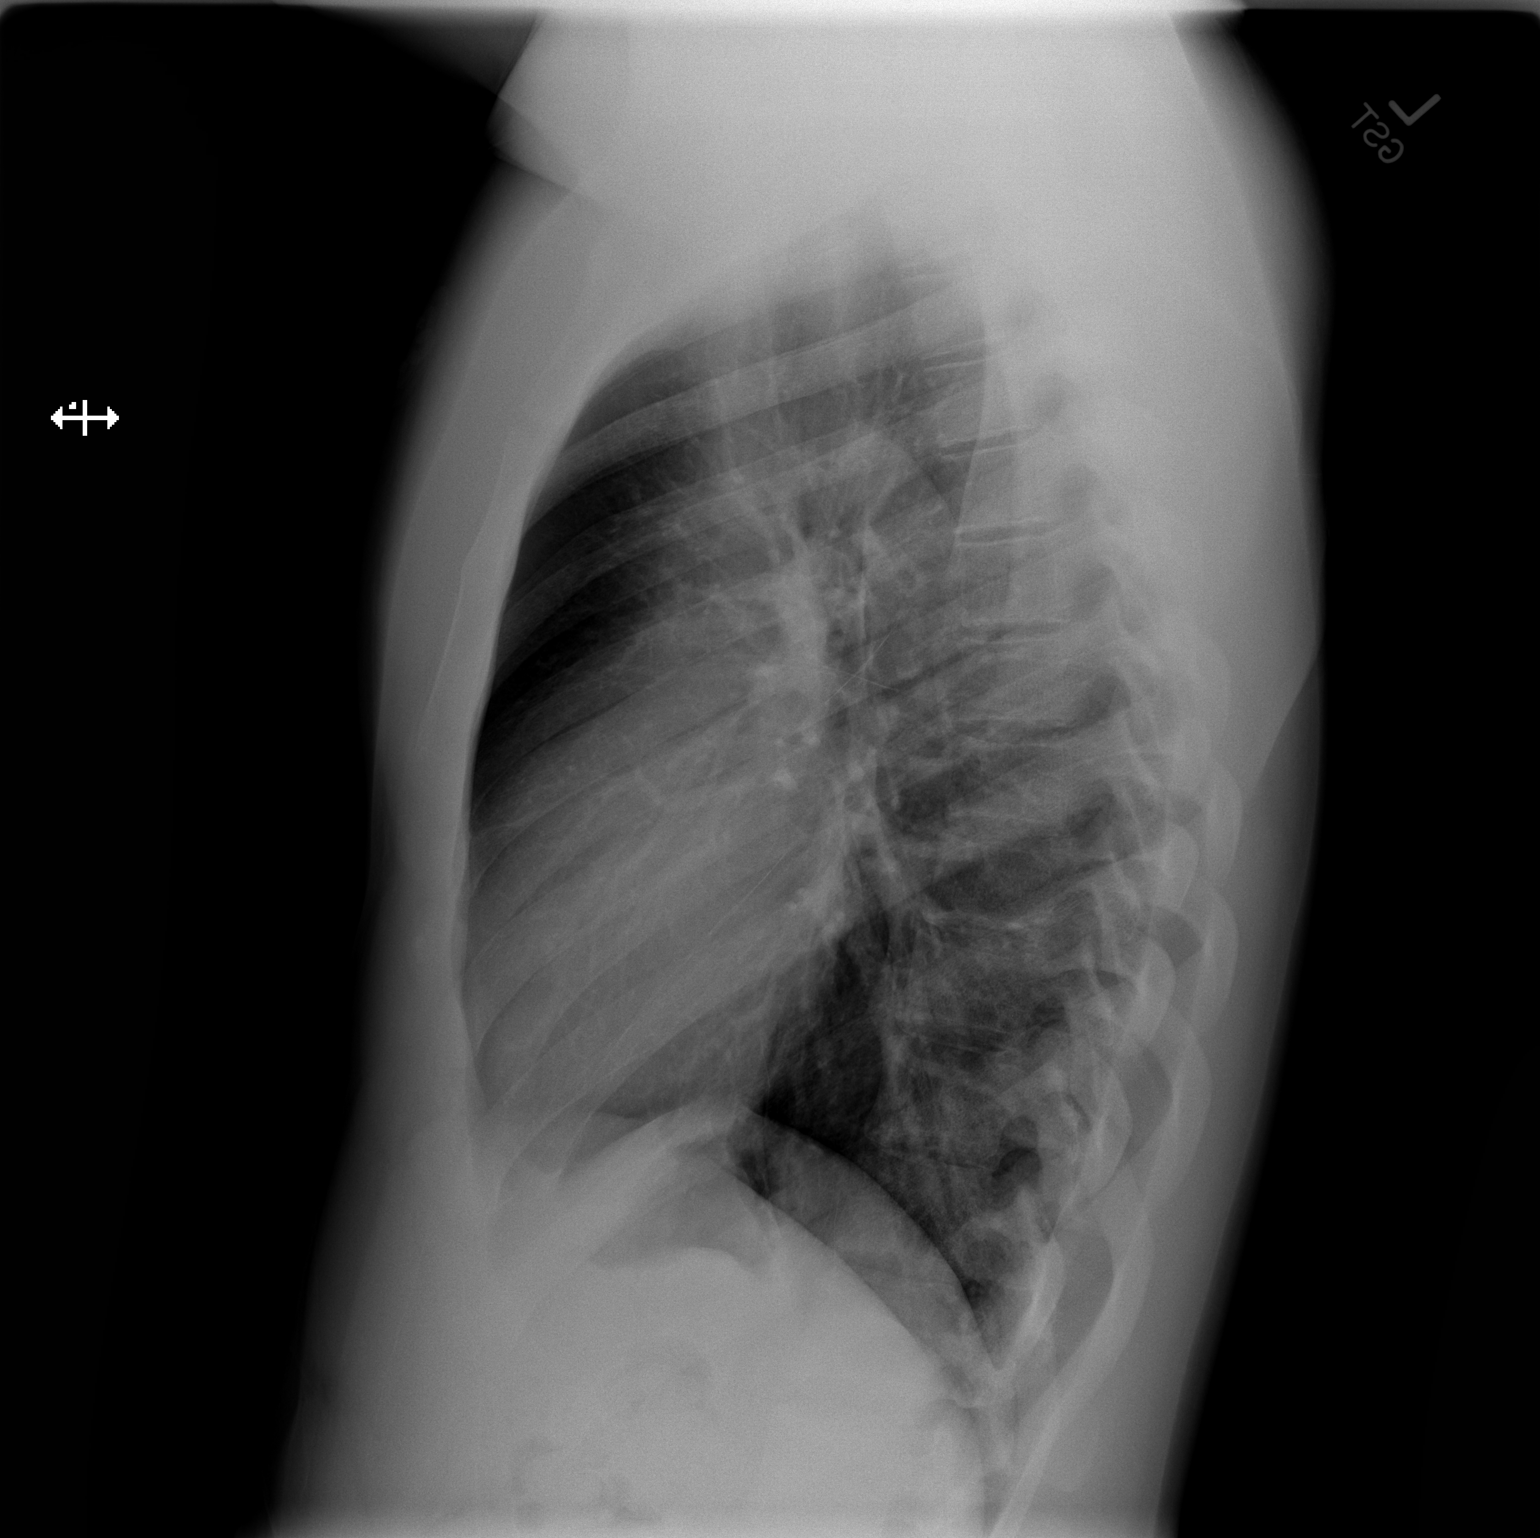

[2 of 2 positions shown; findings below may reference images not displayed]

FINDINGS: The lungs are well-aerated and clear. There is no evidence of focal
opacification, pleural effusion or pneumothorax.

The heart is normal in size; the mediastinal contour is within
normal limits. No acute osseous abnormalities are seen. No free
intra-abdominal air is seen underlying the hemidiaphragms.
IMPRESSION: No acute cardiopulmonary process seen.

## 2023-04-29 ENCOUNTER — Other Ambulatory Visit (HOSPITAL_COMMUNITY): Payer: Self-pay

## 2023-04-29 MED ORDER — AMOXICILLIN 500 MG PO CAPS
500.0000 mg | ORAL_CAPSULE | Freq: Two times a day (BID) | ORAL | 0 refills | Status: AC
  Filled 2023-04-29: qty 14, 7d supply, fill #0

## 2023-05-04 ENCOUNTER — Other Ambulatory Visit (HOSPITAL_COMMUNITY): Payer: Self-pay

## 2023-05-05 ENCOUNTER — Other Ambulatory Visit (HOSPITAL_COMMUNITY): Payer: Self-pay

## 2023-05-05 MED ORDER — DOXYCYCLINE HYCLATE 100 MG PO TABS
100.0000 mg | ORAL_TABLET | Freq: Two times a day (BID) | ORAL | 0 refills | Status: AC
  Filled 2023-05-05 – 2023-05-22 (×2): qty 14, 7d supply, fill #0

## 2023-05-15 ENCOUNTER — Other Ambulatory Visit (HOSPITAL_COMMUNITY): Payer: Self-pay

## 2023-05-22 ENCOUNTER — Other Ambulatory Visit (HOSPITAL_COMMUNITY): Payer: Self-pay

## 2023-05-25 ENCOUNTER — Other Ambulatory Visit (HOSPITAL_COMMUNITY): Payer: Self-pay
